# Patient Record
Sex: Female | Born: 1965
Health system: Southern US, Community
[De-identification: ages and names within clinical notes are randomized; demographics above are authoritative.]

---

## 2008-10-20 ENCOUNTER — Ambulatory Visit: Payer: Self-pay | Admitting: Obstetrics & Gynecology

## 2008-10-23 ENCOUNTER — Ambulatory Visit: Payer: Self-pay | Admitting: Obstetrics & Gynecology

## 2012-09-19 ENCOUNTER — Ambulatory Visit: Payer: Self-pay

## 2012-10-03 ENCOUNTER — Ambulatory Visit: Payer: Self-pay

## 2013-11-18 ENCOUNTER — Emergency Department: Payer: Self-pay | Admitting: Emergency Medicine

## 2015-10-20 ENCOUNTER — Inpatient Hospital Stay
Admission: RE | Admit: 2015-10-20 | Discharge: 2015-10-20 | Disposition: A | Discharge status: Home / Self Care | Payer: Self-pay | Source: Ambulatory Visit | Attending: *Deleted | Admitting: *Deleted

## 2015-10-20 ENCOUNTER — Other Ambulatory Visit: Payer: Self-pay | Admitting: *Deleted

## 2015-10-20 DIAGNOSIS — Z9289 Personal history of other medical treatment: Secondary | ICD-10-CM

## 2015-10-26 ENCOUNTER — Ambulatory Visit: Payer: Self-pay | Attending: Oncology

## 2015-10-26 ENCOUNTER — Ambulatory Visit
Admission: RE | Admit: 2015-10-26 | Discharge: 2015-10-26 | Disposition: A | Discharge status: Home / Self Care | Payer: Self-pay | Source: Ambulatory Visit | Attending: Oncology | Admitting: Oncology

## 2015-10-26 VITALS — BP 135/83 | HR 70 | Temp 98.1°F | Resp 20 | Ht 64.57 in | Wt 163.6 lb

## 2015-10-26 DIAGNOSIS — Z0000 Encounter for general adult medical examination without abnormal findings: Secondary | ICD-10-CM

## 2015-10-26 NOTE — Progress Notes (Signed)
Subjective:     Patient ID: Thomes DinningRene B Risdon, female   DOB: 03/20/1966, 49 y.o.   MRN: [id number]  HPI   Review of Systems     Objective:   Physical Exam  Pulmonary/Chest:   Right breast exhibits no inverted nipple, no mass, no nipple discharge, no skin change and no tenderness. Left breast exhibits no inverted nipple, no mass, no nipple discharge, no skin change and no tenderness. Breasts are asymmetrical.    Patient had esophageal surgery as an infant with scars on right side, back, and mid abdomen.  This is the cause of right breast being 1 cup size smaller than left.       Assessment:     49 [facility]  She had Insurance past two years and [facility]. Patient screened, and meets [facility] eligibility.  Patient does not have insurance, Medicare or Medicaid.  Handout given on Affordable Care Act. Instructed patient on breast self-exam using teach back method    Plan:     Sent for bilateral screening mammogram.

## 2015-11-09 NOTE — Progress Notes (Signed)
Letter mailed from [facility] to notify of normal mammogram results.  Patient to return in one year for annual screening.  Copy to [facility]. 

## 2016-09-27 ENCOUNTER — Other Ambulatory Visit: Payer: Self-pay | Admitting: Obstetrics and Gynecology

## 2016-09-27 DIAGNOSIS — Z1231 Encounter for screening mammogram for malignant neoplasm of breast: Secondary | ICD-10-CM

## 2016-10-31 ENCOUNTER — Ambulatory Visit: Payer: Self-pay

## 2016-12-09 ENCOUNTER — Ambulatory Visit
Admission: RE | Admit: 2016-12-09 | Discharge: 2016-12-09 | Disposition: A | Discharge status: Home / Self Care | Payer: BLUE CROSS/BLUE SHIELD | Source: Ambulatory Visit | Attending: Obstetrics and Gynecology | Admitting: Obstetrics and Gynecology

## 2016-12-09 DIAGNOSIS — Z1231 Encounter for screening mammogram for malignant neoplasm of breast: Secondary | ICD-10-CM | POA: Diagnosis not present

## 2017-12-21 ENCOUNTER — Emergency Department: Payer: BLUE CROSS/BLUE SHIELD

## 2017-12-21 ENCOUNTER — Other Ambulatory Visit: Payer: Self-pay

## 2017-12-21 ENCOUNTER — Emergency Department
Admission: EM | Admit: 2017-12-21 | Discharge: 2017-12-21 | Disposition: A | Discharge status: Home / Self Care | Payer: BLUE CROSS/BLUE SHIELD | Attending: Emergency Medicine | Admitting: Emergency Medicine

## 2017-12-21 DIAGNOSIS — M79601 Pain in right arm: Secondary | ICD-10-CM | POA: Insufficient documentation

## 2017-12-21 MED ORDER — IBUPROFEN 800 MG PO TABS: 800.0000 mg | ORAL_TABLET | Freq: Three times a day (TID) | ORAL | 0 refills | Status: AC | PRN

## 2017-12-21 MED ORDER — CYCLOBENZAPRINE HCL 5 MG PO TABS: ORAL_TABLET | ORAL | 0 refills | Status: DC
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
## 2018-01-08 ENCOUNTER — Other Ambulatory Visit: Payer: Self-pay | Admitting: Obstetrics and Gynecology

## 2018-01-08 DIAGNOSIS — Z1231 Encounter for screening mammogram for malignant neoplasm of breast: Secondary | ICD-10-CM

## 2018-01-19 ENCOUNTER — Ambulatory Visit
Admission: RE | Admit: 2018-01-19 | Discharge: 2018-01-19 | Disposition: A | Discharge status: Home / Self Care | Payer: BLUE CROSS/BLUE SHIELD | Source: Ambulatory Visit | Attending: Obstetrics and Gynecology | Admitting: Obstetrics and Gynecology

## 2018-01-19 DIAGNOSIS — Z1231 Encounter for screening mammogram for malignant neoplasm of breast: Secondary | ICD-10-CM | POA: Insufficient documentation

## 2018-06-12 IMAGING — MG MM DIGITAL SCREENING BILAT W/ CAD
5 series · 5 of 5 positions shown · non-contrast
Comparison: Previous exam(s).

CLINICAL DATA: Screening. Prior right lateral chest wall surgery
with the scar extending into the far outer right breast.

EXAM:
DIGITAL SCREENING BILATERAL MAMMOGRAM WITH CAD

[R CC (1 of 2)]
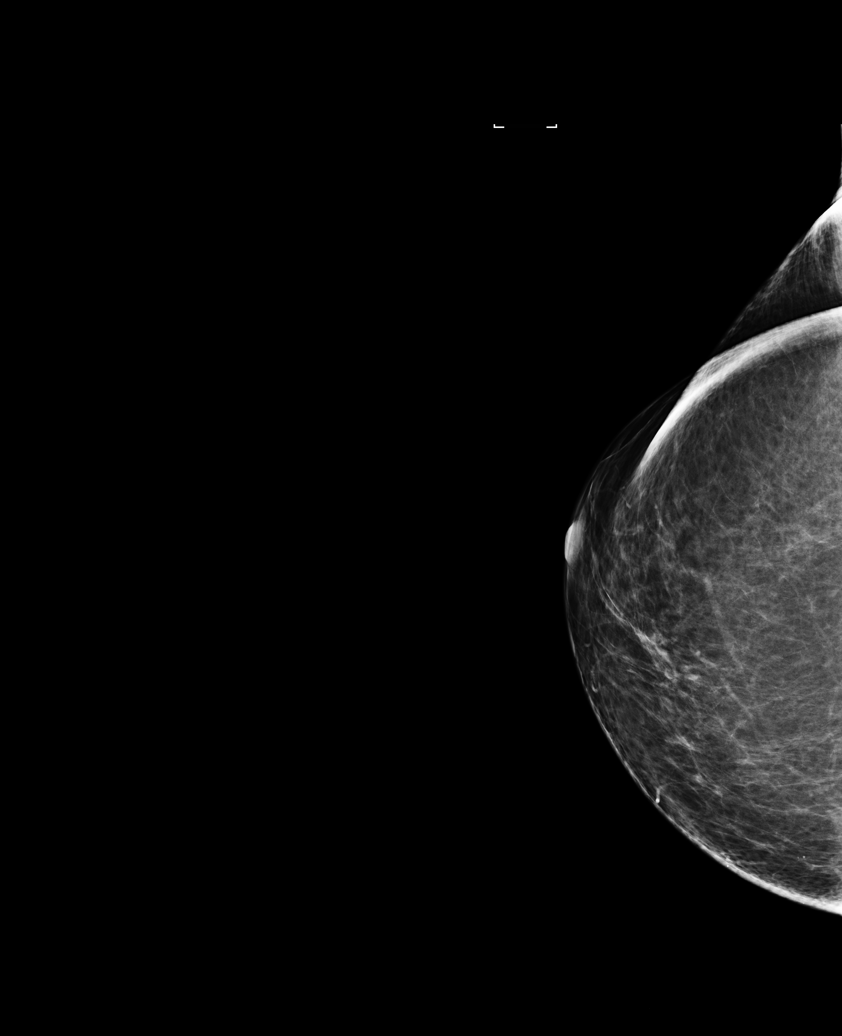

[R CC (2 of 2)]
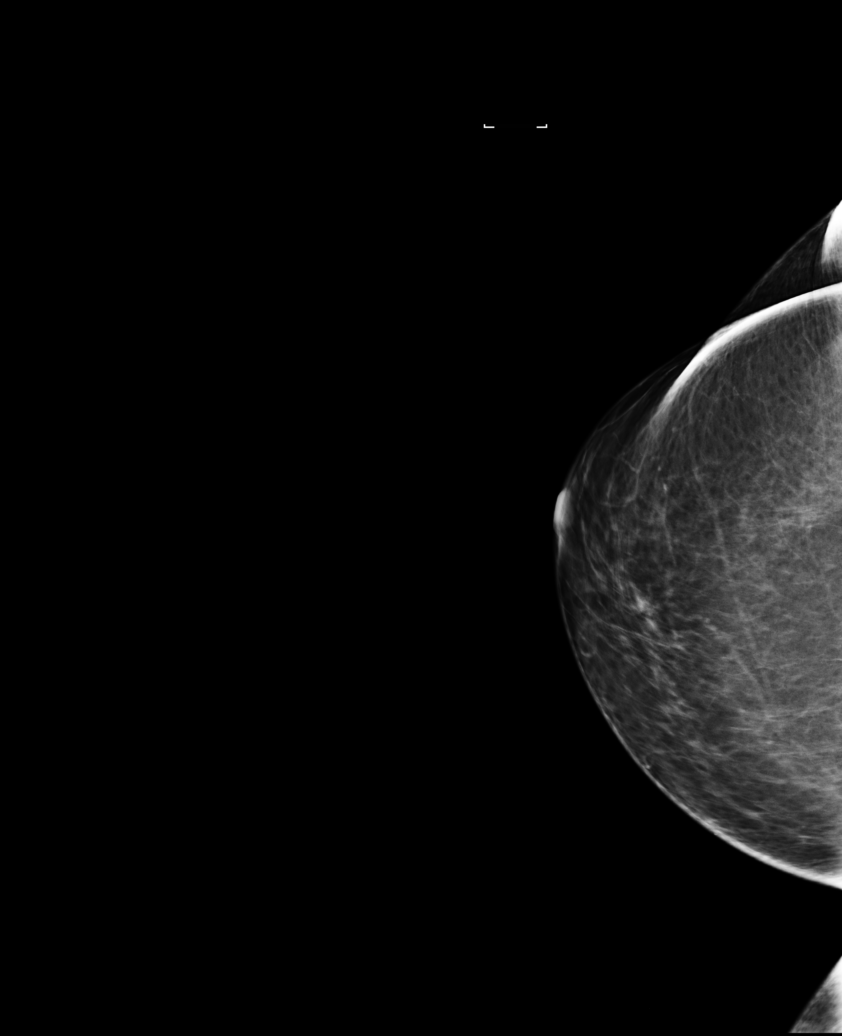

[L MLO]
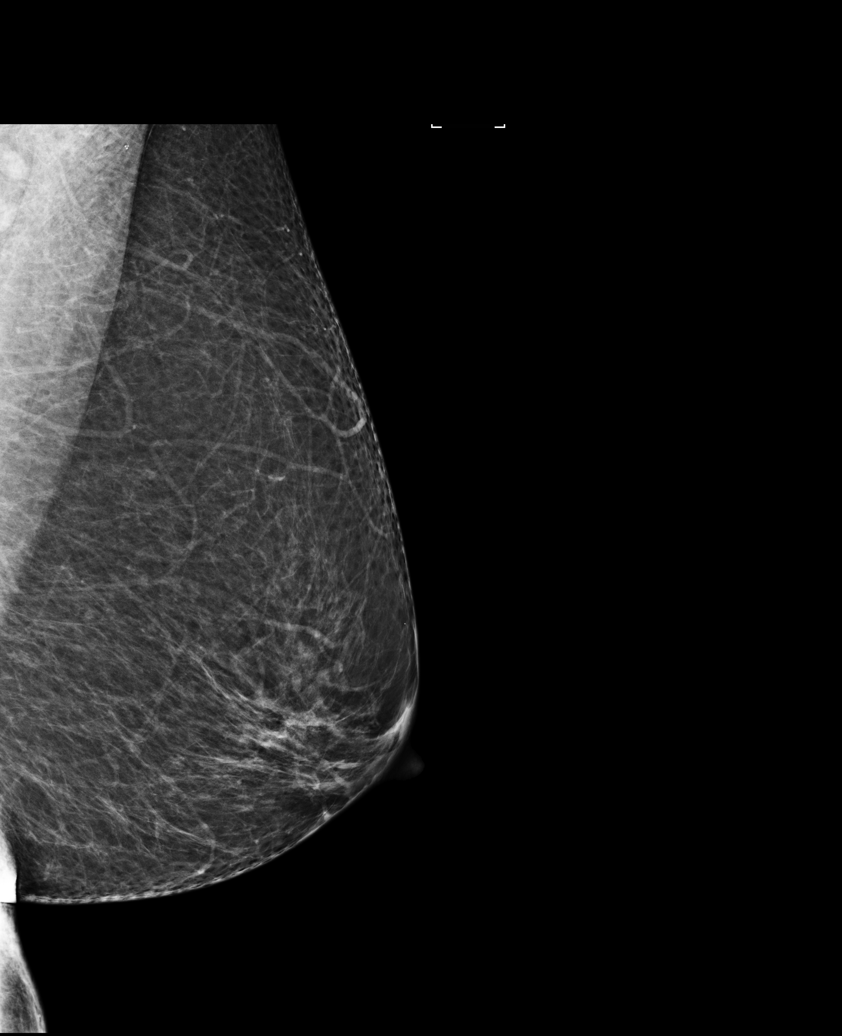

[L CC]
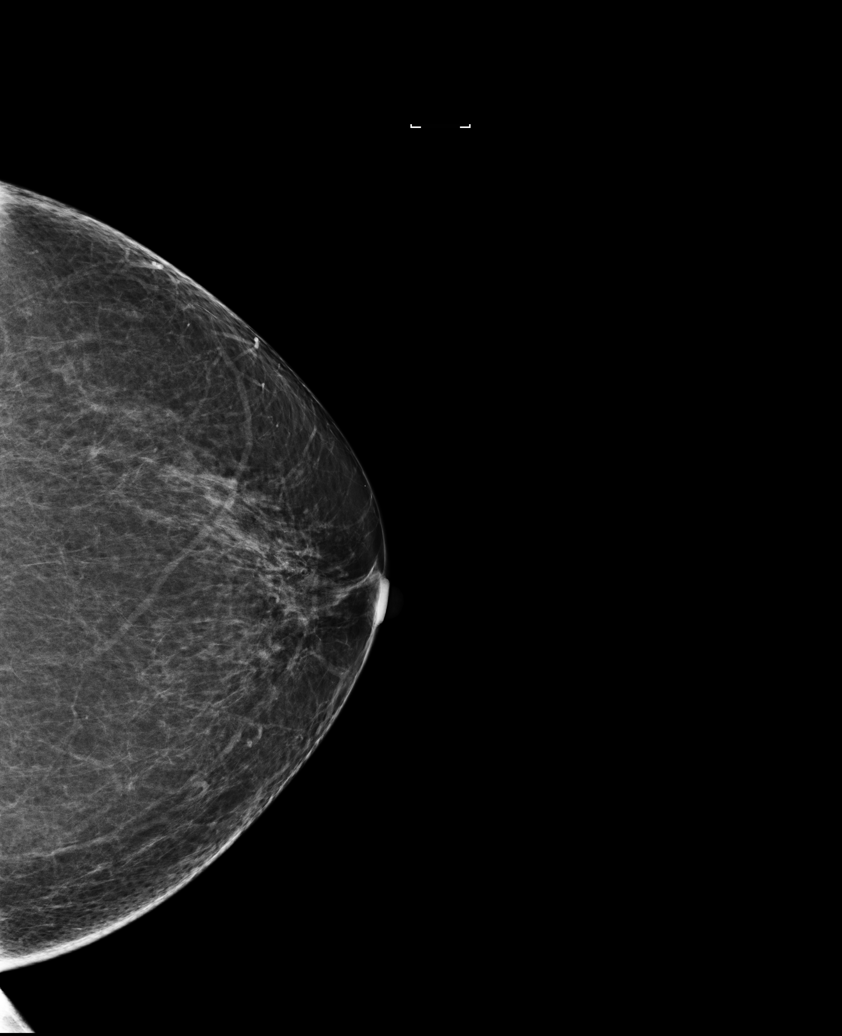

[R MLO]
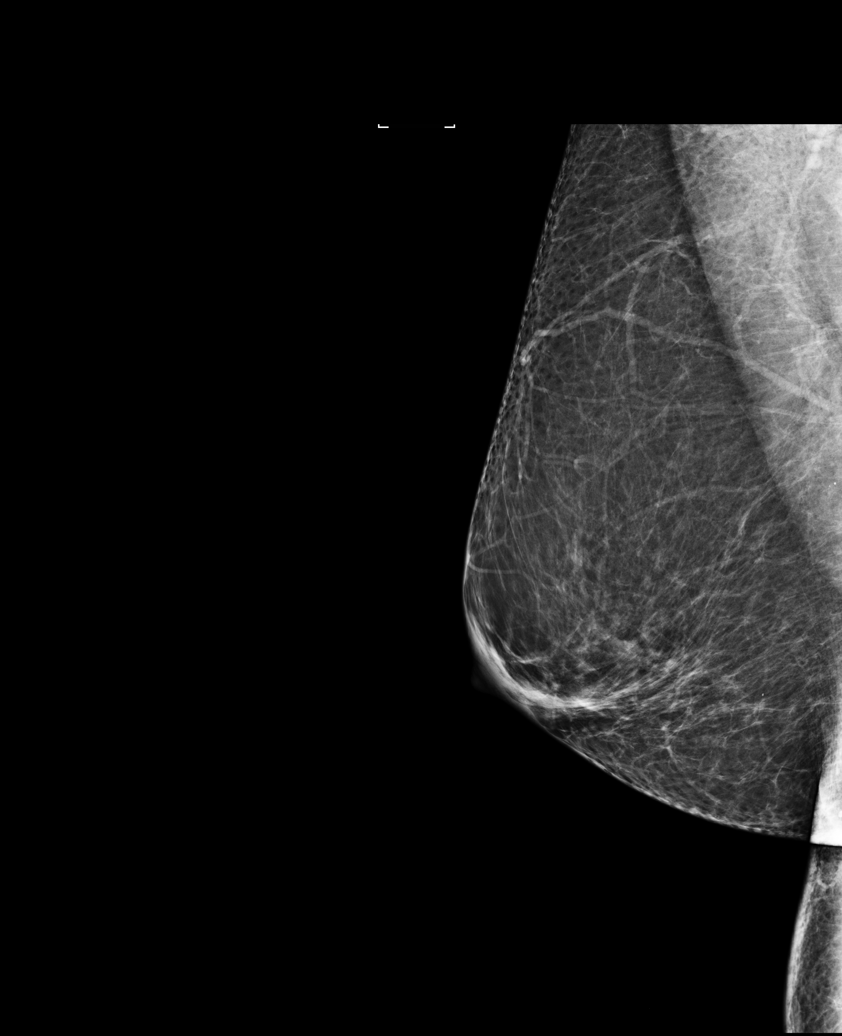

[5 of 5 positions shown; findings below may reference images not displayed]

ACR Breast Density Category b: There are scattered areas of
fibroglandular density.
FINDINGS: There are no findings suspicious for malignancy. Stable post
surgical scarring involving the outer right breast, most conspicuous
on the CC view. Images were processed with CAD.
IMPRESSION: No mammographic evidence of malignancy. A result letter of this
screening mammogram will be mailed directly to the patient.

RECOMMENDATION:
Screening mammogram in one year. (Code:LC-6-8YW)

BI-RADS CATEGORY  2: Benign.

## 2019-01-14 ENCOUNTER — Other Ambulatory Visit: Payer: Self-pay | Admitting: Obstetrics and Gynecology

## 2019-01-14 DIAGNOSIS — Z1231 Encounter for screening mammogram for malignant neoplasm of breast: Secondary | ICD-10-CM

## 2019-01-28 ENCOUNTER — Ambulatory Visit
Admission: RE | Admit: 2019-01-28 | Discharge: 2019-01-28 | Disposition: A | Discharge status: Home / Self Care | Payer: BLUE CROSS/BLUE SHIELD | Source: Ambulatory Visit | Attending: Obstetrics and Gynecology | Admitting: Obstetrics and Gynecology

## 2019-01-28 DIAGNOSIS — Z1231 Encounter for screening mammogram for malignant neoplasm of breast: Secondary | ICD-10-CM | POA: Insufficient documentation

## 2019-02-11 DIAGNOSIS — N762 Acute vulvitis: Secondary | ICD-10-CM | POA: Diagnosis not present

## 2019-09-07 ENCOUNTER — Emergency Department (HOSPITAL_COMMUNITY)
Admission: EM | Admit: 2019-09-07 | Discharge: 2019-09-07 | Disposition: A | Discharge status: Home / Self Care | Payer: BLUE CROSS/BLUE SHIELD | Attending: Emergency Medicine | Admitting: Emergency Medicine

## 2019-09-07 ENCOUNTER — Other Ambulatory Visit: Payer: Self-pay

## 2019-09-07 ENCOUNTER — Encounter (HOSPITAL_COMMUNITY): Payer: Self-pay

## 2019-09-07 ENCOUNTER — Emergency Department (HOSPITAL_COMMUNITY): Payer: BLUE CROSS/BLUE SHIELD

## 2019-09-07 DIAGNOSIS — K219 Gastro-esophageal reflux disease without esophagitis: Secondary | ICD-10-CM | POA: Diagnosis not present

## 2019-09-07 DIAGNOSIS — R0789 Other chest pain: Secondary | ICD-10-CM | POA: Insufficient documentation

## 2019-09-07 DIAGNOSIS — R079 Chest pain, unspecified: Secondary | ICD-10-CM | POA: Diagnosis not present

## 2019-09-07 LAB — TROPONIN I (HIGH SENSITIVITY)
Troponin I (High Sensitivity): 4 ng/L (ref <18)
Troponin I (High Sensitivity): 4 ng/L (ref <18)

## 2019-09-07 LAB — BASIC METABOLIC PANEL
Anion gap: 8 (ref 5–15)
BUN: 14 mg/dL (ref 6–20)
CO2: 27 mmol/L (ref 22–32)
Calcium: 9.7 mg/dL (ref 8.9–10.3)
Chloride: 107 mmol/L (ref 98–111)
Creatinine, Ser: 1.24 mg/dL — ABNORMAL HIGH (ref 0.44–1.00)
GFR calc Af Amer: 57 mL/min — ABNORMAL LOW (ref >60)
GFR calc non Af Amer: 49 mL/min — ABNORMAL LOW (ref >60)
Glucose, Bld: 91 mg/dL (ref 70–99)
Potassium: 4 mmol/L (ref 3.5–5.1)
Sodium: 142 mmol/L (ref 135–145)

## 2019-09-07 LAB — CBC
HCT: 43 % (ref 36.0–46.0)
Hemoglobin: 13.9 g/dL (ref 12.0–15.0)
MCH: 30.8 pg (ref 26.0–34.0)
MCHC: 32.3 g/dL (ref 30.0–36.0)
MCV: 95.1 fL (ref 80.0–100.0)
Platelets: 255 10*3/uL (ref 150–400)
RBC: 4.52 MIL/uL (ref 3.87–5.11)
RDW: 11.5 % (ref 11.5–15.5)
WBC: 8.1 10*3/uL (ref 4.0–10.5)
nRBC: 0 % (ref 0.0–0.2)

## 2019-09-07 LAB — I-STAT BETA HCG BLOOD, ED (MC, WL, AP ONLY): I-stat hCG, quantitative: <5 m[IU]/mL (ref <5)

## 2019-09-07 MED ORDER — CYCLOBENZAPRINE HCL 10 MG PO TABS
10.0000 mg | ORAL_TABLET | Freq: Once | ORAL | Status: AC
  Administered 2019-09-07: 18:00:00 10 mg via ORAL
  Filled 2019-09-07: qty 1

## 2019-09-07 MED ORDER — CYCLOBENZAPRINE HCL 5 MG PO TABS: ORAL_TABLET | ORAL | 0 refills | Status: AC

## 2019-09-07 MED ORDER — SODIUM CHLORIDE 0.9% FLUSH: 3.0000 mL | Freq: Once | INTRAVENOUS | Status: DC

## 2019-09-07 NOTE — ED Triage Notes (Signed)
Pt presents with Left side chest pain radiating to her back x3 days

## 2019-09-07 NOTE — ED Provider Notes (Signed)
[facility]ENC[facility] Provider Note   CSN: [id number] Arrival date & time: 09/07/19  1558     History   Chief Complaint Chief Complaint  Patient presents with  . Chest Pain    HPI Thomes DinningRene B Tersigni is a 53 y.o. female with PMHx of esophageal reflux presenting to the ED with chest pain since Thursday that started when she was cleaning. Some relief with muscle relaxant but this morning feels like pain has shifted to left shoulder and wanted to get it checked out. Patient has taken muscle relaxant and aleve today without any significant relief. Patient reports that pain is worse with movement. She denies any shortness of breath, chest tightness, headache, dizziness, nausea or vomiting.    History reviewed. No pertinent past medical history.  There are no active problems to display for this patient.   History reviewed. No pertinent surgical history.   OB History   No obstetric history on file.      Home Medications    Prior to Admission medications   Medication Sig Start Date End Date Taking? Authorizing Provider  cyclobenzaprine (FLEXERIL) 5 MG tablet Take 1-2 tablets 3 times daily as needed 12/21/17   Enid DerryWagner, Ashley, PA-C  ibuprofen (ADVIL,MOTRIN) 800 MG tablet Take 1 tablet (800 mg total) by mouth every 8 (eight) hours as needed. 12/21/17   Enid DerryWagner, Ashley, PA-C    Family History Family History  Problem Relation Age of Onset  . Breast cancer Neg Hx     Social History Social History   Tobacco Use  . Smoking status: Never Smoker  . Smokeless tobacco: Never Used  Substance Use Topics  . Alcohol use: Not on file  . Drug use: Not on file     Allergies   Aspirin   Review of Systems Review of Systems  Constitutional: Negative for activity change, chills, diaphoresis and fever.  HENT: Negative for sore throat.   Eyes: Negative for photophobia and visual disturbance.  Respiratory: Negative for choking, shortness of breath and wheezing.    Cardiovascular: Positive for chest pain. Negative for palpitations.       Left sided chest pain  Gastrointestinal: Negative.   Endocrine: Negative.   Genitourinary: Negative.   Musculoskeletal: Positive for arthralgias. Negative for back pain, joint swelling, neck pain and neck stiffness.       Left shoulder pain  Allergic/Immunologic: Negative.   Neurological: Negative for dizziness, numbness and headaches.  Hematological: Negative.   Psychiatric/Behavioral: Negative.    Physical Exam Updated Vital Signs BP (!) 155/70   Pulse 77   Temp 97.8 F (36.6 C) (Oral)   Resp 16   LMP 11/25/2016   SpO2 100%   Physical Exam Vitals signs reviewed.  Constitutional:      General: She is not in acute distress.    Appearance: She is well-developed. She is not diaphoretic.  HENT:     Head: Normocephalic and atraumatic.  Cardiovascular:     Rate and Rhythm: Normal rate and regular rhythm.     Heart sounds: Normal heart sounds. Heart sounds not distant. No murmur. No friction rub. No gallop. No S3 or S4 sounds.   Pulmonary:     Effort: Pulmonary effort is normal. No respiratory distress.     Breath sounds: Normal breath sounds.  Chest:     Chest wall: No tenderness, crepitus or edema.  Abdominal:     General: Bowel sounds are normal.     Palpations: Abdomen is soft.  Tenderness: There is no abdominal tenderness. There is no guarding.  Musculoskeletal: Normal range of motion.     Comments: Pain with active flexion of shoulder  Skin:    General: Skin is warm and dry.     Capillary Refill: Capillary refill takes less than 2 seconds.  Neurological:     General: No focal deficit present.     Mental Status: She is alert and oriented to person, place, and time.     Motor: No weakness.     ED Treatments / Results  Labs (all labs ordered are listed, but only abnormal results are displayed) Labs Reviewed  BASIC METABOLIC PANEL - Abnormal; Notable for the following components:       Result Value   Creatinine, Ser 1.24 (*)    GFR calc non Af Amer 49 (*)    GFR calc Af Amer 57 (*)    All other components within normal limits  CBC  I-STAT BETA HCG BLOOD, ED ([facility], [facility], [facility] ONLY)  TROPONIN I (HIGH SENSITIVITY)  TROPONIN I (HIGH SENSITIVITY)    EKG EKG Interpretation  Date/Time:  Saturday September 07 2019 16:04:25 EDT Ventricular Rate:  83 PR Interval:  136 QRS Duration: 82 QT Interval:  390 QTC Calculation: 458 R Axis:   82 Text Interpretation:  Normal sinus rhythm Possible Left atrial enlargement No previous tracing Confirmed by Gwyneth Sprout ([id number]) on 09/07/2019 5:14:05 PM   Radiology Dg Chest 2 View  Result Date: 09/07/2019 CLINICAL DATA:  Chest pain EXAM: CHEST - 2 VIEW COMPARISON:  None. FINDINGS: No acute opacity or pleural effusion. Normal cardiomediastinal silhouette. No pneumothorax. Bilateral right greater than left rib anomalies. IMPRESSION: No active cardiopulmonary disease. Electronically Signed   By: Jasmine Pang M.D.   On: 09/07/2019 16:54    Procedures Procedures (including critical care time)  Medications Ordered in ED Medications  sodium chloride flush (NS) 0.9 % injection 3 mL (has no administration in time range)  cyclobenzaprine (FLEXERIL) tablet 10 mg (has no administration in time range)     Initial Impression / Assessment and Plan / ED Course  I have reviewed the triage vital signs and the nursing notes.  Pertinent labs & imaging results that were available during my care of the patient were reviewed by me and considered in my medical decision making (see chart for details).  Patient is a 52yo female with hx of reflux presenting with left sided chest pain for 3 days duration that is radiating to the left shoulder. Patient reports that pain started when she was cleaning on Thursday and was left sided in nature. She took some Aleve which relieved the pain. However, this morning, patient reports that she woke up with left shoulder  pain that is similar in nature to the chest pain she experienced on Thursday. She tried taking muscle relaxants and Aleve but that did not provide any significant relief. Pain is worsened with movement. She spoke with her sister who prompted her to present to the ED for further evaluation. Patient denies any continued chest pain, shortness of breath, nausea/vomiting, headache or dizziness.  She appears comfortable and does not appear to be in any acute distress. No cardiac murmurs, rubs or gallops appreciated. EKG with NSR and no acute ST changes. No cardiopulmonary disease on CXR noted. Serial troponins without change. Slight elevation in Cr level but no prior values to compare to so would recommend follow up with PCP.   Patient given flexeril in ED with improvement in symptoms.  Patient discharged to home with instructions to continue flexeril and apply heat/ice to the area. She was also instructed to follow up with PCP regarding increased creatinine. Strict return precautions provided. Patient agrees with discharge planning.   Final Clinical Impressions(s) / ED Diagnoses   Final diagnoses:  None    ED Discharge Orders    None       Harvie Heck, MD 09/07/19 2042    Blanchie Dessert, MD 09/08/19 1851

## 2019-09-07 NOTE — Discharge Instructions (Addendum)
Ms. Mandy Reed, Mandy Reed were seen in the ED today for concerns of chest pain radiating to your left shoulder. After a complete work up, it does not appear that your chest pain is cardiac in nature. It is likely due to a muscle strain. Please use your flexeril at home as needed and use ice/heat to the area as tolerated.  On labs, your creatinine was slightly elevated. Please follow up with a PCP for further evaluation. In the meantime, assure adequate hydration and avoid NSAID use.  If your chest pain worsens or you develop shortness of breath, or fevers/chills, please seek emergent medical care.

## 2019-10-01 DIAGNOSIS — Z23 Encounter for immunization: Secondary | ICD-10-CM | POA: Diagnosis not present

## 2019-10-01 DIAGNOSIS — M79671 Pain in right foot: Secondary | ICD-10-CM | POA: Diagnosis not present

## 2019-10-01 DIAGNOSIS — M76821 Posterior tibial tendinitis, right leg: Secondary | ICD-10-CM | POA: Diagnosis not present

## 2019-10-08 DIAGNOSIS — Z1159 Encounter for screening for other viral diseases: Secondary | ICD-10-CM | POA: Diagnosis not present

## 2019-10-11 DIAGNOSIS — D122 Benign neoplasm of ascending colon: Secondary | ICD-10-CM | POA: Diagnosis not present

## 2019-10-11 DIAGNOSIS — Z1211 Encounter for screening for malignant neoplasm of colon: Secondary | ICD-10-CM | POA: Diagnosis not present

## 2020-03-10 DIAGNOSIS — Z20822 Contact with and (suspected) exposure to covid-19: Secondary | ICD-10-CM | POA: Diagnosis not present

## 2020-05-29 DIAGNOSIS — Z1231 Encounter for screening mammogram for malignant neoplasm of breast: Secondary | ICD-10-CM | POA: Diagnosis not present

## 2020-05-29 DIAGNOSIS — Z0001 Encounter for general adult medical examination with abnormal findings: Secondary | ICD-10-CM | POA: Diagnosis not present

## 2020-05-29 DIAGNOSIS — Z23 Encounter for immunization: Secondary | ICD-10-CM | POA: Diagnosis not present

## 2020-05-29 DIAGNOSIS — Z1211 Encounter for screening for malignant neoplasm of colon: Secondary | ICD-10-CM | POA: Diagnosis not present

## 2020-06-01 ENCOUNTER — Other Ambulatory Visit: Payer: Self-pay | Admitting: Internal Medicine

## 2020-06-01 ENCOUNTER — Other Ambulatory Visit: Payer: Self-pay | Admitting: Obstetrics and Gynecology

## 2020-06-01 DIAGNOSIS — R221 Localized swelling, mass and lump, neck: Secondary | ICD-10-CM

## 2020-06-01 DIAGNOSIS — Z1231 Encounter for screening mammogram for malignant neoplasm of breast: Secondary | ICD-10-CM

## 2020-06-05 DIAGNOSIS — Z0001 Encounter for general adult medical examination with abnormal findings: Secondary | ICD-10-CM | POA: Diagnosis not present

## 2020-06-05 DIAGNOSIS — Z131 Encounter for screening for diabetes mellitus: Secondary | ICD-10-CM | POA: Diagnosis not present

## 2020-06-05 DIAGNOSIS — Z1329 Encounter for screening for other suspected endocrine disorder: Secondary | ICD-10-CM | POA: Diagnosis not present

## 2020-06-05 DIAGNOSIS — Z136 Encounter for screening for cardiovascular disorders: Secondary | ICD-10-CM | POA: Diagnosis not present

## 2020-06-12 ENCOUNTER — Ambulatory Visit: Payer: BLUE CROSS/BLUE SHIELD

## 2020-06-12 ENCOUNTER — Other Ambulatory Visit: Payer: BLUE CROSS/BLUE SHIELD

## 2020-06-19 ENCOUNTER — Ambulatory Visit: Payer: BLUE CROSS/BLUE SHIELD

## 2020-06-22 ENCOUNTER — Ambulatory Visit
Admission: RE | Admit: 2020-06-22 | Discharge: 2020-06-22 | Disposition: A | Discharge status: Home / Self Care | Payer: BC Managed Care – PPO | Source: Ambulatory Visit | Attending: Obstetrics and Gynecology | Admitting: Obstetrics and Gynecology

## 2020-06-22 ENCOUNTER — Ambulatory Visit
Admission: RE | Admit: 2020-06-22 | Discharge: 2020-06-22 | Disposition: A | Discharge status: Home / Self Care | Payer: BC Managed Care – PPO | Source: Ambulatory Visit | Attending: Internal Medicine | Admitting: Internal Medicine

## 2020-06-22 ENCOUNTER — Other Ambulatory Visit: Payer: Self-pay

## 2020-06-22 DIAGNOSIS — R221 Localized swelling, mass and lump, neck: Secondary | ICD-10-CM | POA: Diagnosis not present

## 2020-06-22 DIAGNOSIS — K228 Other specified diseases of esophagus: Secondary | ICD-10-CM | POA: Diagnosis not present

## 2020-06-22 DIAGNOSIS — Z1231 Encounter for screening mammogram for malignant neoplasm of breast: Secondary | ICD-10-CM

## 2020-06-22 DIAGNOSIS — M419 Scoliosis, unspecified: Secondary | ICD-10-CM | POA: Diagnosis not present

## 2020-06-22 DIAGNOSIS — M958 Other specified acquired deformities of musculoskeletal system: Secondary | ICD-10-CM | POA: Diagnosis not present

## 2020-06-22 MED ORDER — IOPAMIDOL (ISOVUE-300) INJECTION 61%
75.0000 mL | Freq: Once | INTRAVENOUS | Status: AC | PRN
  Administered 2020-06-22: 16:00:00 75 mL via INTRAVENOUS

## 2020-07-09 DIAGNOSIS — R7303 Prediabetes: Secondary | ICD-10-CM | POA: Diagnosis not present

## 2020-07-09 DIAGNOSIS — I10 Essential (primary) hypertension: Secondary | ICD-10-CM | POA: Diagnosis not present

## 2020-07-09 DIAGNOSIS — E785 Hyperlipidemia, unspecified: Secondary | ICD-10-CM | POA: Diagnosis not present

## 2020-07-09 DIAGNOSIS — E663 Overweight: Secondary | ICD-10-CM | POA: Diagnosis not present

## 2020-09-29 DIAGNOSIS — E785 Hyperlipidemia, unspecified: Secondary | ICD-10-CM | POA: Diagnosis not present

## 2020-09-29 DIAGNOSIS — R7303 Prediabetes: Secondary | ICD-10-CM | POA: Diagnosis not present

## 2020-09-29 DIAGNOSIS — I10 Essential (primary) hypertension: Secondary | ICD-10-CM | POA: Diagnosis not present

## 2020-09-29 DIAGNOSIS — E663 Overweight: Secondary | ICD-10-CM | POA: Diagnosis not present

## 2020-09-29 DIAGNOSIS — Z23 Encounter for immunization: Secondary | ICD-10-CM | POA: Diagnosis not present

## 2020-11-03 DIAGNOSIS — F419 Anxiety disorder, unspecified: Secondary | ICD-10-CM | POA: Diagnosis not present

## 2020-11-03 DIAGNOSIS — G4709 Other insomnia: Secondary | ICD-10-CM | POA: Diagnosis not present

## 2020-12-09 DIAGNOSIS — Z20822 Contact with and (suspected) exposure to covid-19: Secondary | ICD-10-CM | POA: Diagnosis not present

## 2021-01-07 DIAGNOSIS — H40013 Open angle with borderline findings, low risk, bilateral: Secondary | ICD-10-CM | POA: Diagnosis not present

## 2021-08-22 DIAGNOSIS — Z20822 Contact with and (suspected) exposure to covid-19: Secondary | ICD-10-CM | POA: Diagnosis not present

## 2021-09-02 DIAGNOSIS — I10 Essential (primary) hypertension: Secondary | ICD-10-CM | POA: Diagnosis not present

## 2021-09-02 DIAGNOSIS — R7303 Prediabetes: Secondary | ICD-10-CM | POA: Diagnosis not present

## 2021-09-02 DIAGNOSIS — Z0001 Encounter for general adult medical examination with abnormal findings: Secondary | ICD-10-CM | POA: Diagnosis not present

## 2021-09-02 DIAGNOSIS — F411 Generalized anxiety disorder: Secondary | ICD-10-CM | POA: Diagnosis not present

## 2021-09-02 DIAGNOSIS — Z23 Encounter for immunization: Secondary | ICD-10-CM | POA: Diagnosis not present

## 2021-09-02 DIAGNOSIS — Z01419 Encounter for gynecological examination (general) (routine) without abnormal findings: Secondary | ICD-10-CM | POA: Diagnosis not present

## 2021-09-02 DIAGNOSIS — E785 Hyperlipidemia, unspecified: Secondary | ICD-10-CM | POA: Diagnosis not present

## 2021-09-02 DIAGNOSIS — N182 Chronic kidney disease, stage 2 (mild): Secondary | ICD-10-CM | POA: Diagnosis not present

## 2021-09-02 DIAGNOSIS — Z1211 Encounter for screening for malignant neoplasm of colon: Secondary | ICD-10-CM | POA: Diagnosis not present

## 2021-09-02 DIAGNOSIS — R8290 Unspecified abnormal findings in urine: Secondary | ICD-10-CM | POA: Diagnosis not present

## 2021-09-03 ENCOUNTER — Other Ambulatory Visit: Payer: Self-pay | Admitting: Internal Medicine

## 2021-09-03 DIAGNOSIS — Z1231 Encounter for screening mammogram for malignant neoplasm of breast: Secondary | ICD-10-CM

## 2021-09-16 DIAGNOSIS — E663 Overweight: Secondary | ICD-10-CM | POA: Diagnosis not present

## 2021-09-16 DIAGNOSIS — E785 Hyperlipidemia, unspecified: Secondary | ICD-10-CM | POA: Diagnosis not present

## 2021-09-16 DIAGNOSIS — Z0001 Encounter for general adult medical examination with abnormal findings: Secondary | ICD-10-CM | POA: Diagnosis not present

## 2021-09-16 DIAGNOSIS — R7303 Prediabetes: Secondary | ICD-10-CM | POA: Diagnosis not present

## 2021-09-23 ENCOUNTER — Other Ambulatory Visit: Payer: Self-pay

## 2021-09-23 ENCOUNTER — Ambulatory Visit
Admission: RE | Admit: 2021-09-23 | Discharge: 2021-09-23 | Disposition: A | Discharge status: Home / Self Care | Payer: BC Managed Care – PPO | Source: Ambulatory Visit | Attending: Internal Medicine | Admitting: Internal Medicine

## 2021-09-23 DIAGNOSIS — Z1231 Encounter for screening mammogram for malignant neoplasm of breast: Secondary | ICD-10-CM | POA: Diagnosis not present

## 2021-10-05 DIAGNOSIS — I10 Essential (primary) hypertension: Secondary | ICD-10-CM | POA: Diagnosis not present

## 2021-10-05 DIAGNOSIS — N182 Chronic kidney disease, stage 2 (mild): Secondary | ICD-10-CM | POA: Diagnosis not present

## 2021-10-05 DIAGNOSIS — E785 Hyperlipidemia, unspecified: Secondary | ICD-10-CM | POA: Diagnosis not present

## 2022-01-06 DIAGNOSIS — L708 Other acne: Secondary | ICD-10-CM | POA: Diagnosis not present

## 2022-01-26 DIAGNOSIS — N182 Chronic kidney disease, stage 2 (mild): Secondary | ICD-10-CM | POA: Diagnosis not present

## 2022-01-28 DIAGNOSIS — I10 Essential (primary) hypertension: Secondary | ICD-10-CM | POA: Diagnosis not present

## 2022-01-28 DIAGNOSIS — N182 Chronic kidney disease, stage 2 (mild): Secondary | ICD-10-CM | POA: Diagnosis not present

## 2022-01-28 DIAGNOSIS — F411 Generalized anxiety disorder: Secondary | ICD-10-CM | POA: Diagnosis not present

## 2022-04-04 DIAGNOSIS — H40013 Open angle with borderline findings, low risk, bilateral: Secondary | ICD-10-CM | POA: Diagnosis not present

## 2022-05-23 DIAGNOSIS — Z0000 Encounter for general adult medical examination without abnormal findings: Secondary | ICD-10-CM | POA: Diagnosis not present

## 2022-05-23 DIAGNOSIS — E785 Hyperlipidemia, unspecified: Secondary | ICD-10-CM | POA: Diagnosis not present

## 2022-05-23 DIAGNOSIS — R7303 Prediabetes: Secondary | ICD-10-CM | POA: Diagnosis not present

## 2022-05-23 DIAGNOSIS — N182 Chronic kidney disease, stage 2 (mild): Secondary | ICD-10-CM | POA: Diagnosis not present

## 2022-05-27 DIAGNOSIS — I10 Essential (primary) hypertension: Secondary | ICD-10-CM | POA: Diagnosis not present

## 2022-05-27 DIAGNOSIS — R8290 Unspecified abnormal findings in urine: Secondary | ICD-10-CM | POA: Diagnosis not present

## 2022-05-27 DIAGNOSIS — Z1211 Encounter for screening for malignant neoplasm of colon: Secondary | ICD-10-CM | POA: Diagnosis not present

## 2022-05-27 DIAGNOSIS — N182 Chronic kidney disease, stage 2 (mild): Secondary | ICD-10-CM | POA: Diagnosis not present

## 2022-05-27 DIAGNOSIS — E785 Hyperlipidemia, unspecified: Secondary | ICD-10-CM | POA: Diagnosis not present

## 2022-05-27 DIAGNOSIS — Z23 Encounter for immunization: Secondary | ICD-10-CM | POA: Diagnosis not present

## 2022-05-27 DIAGNOSIS — Z1231 Encounter for screening mammogram for malignant neoplasm of breast: Secondary | ICD-10-CM | POA: Diagnosis not present

## 2022-05-27 DIAGNOSIS — K219 Gastro-esophageal reflux disease without esophagitis: Secondary | ICD-10-CM | POA: Diagnosis not present

## 2022-05-27 DIAGNOSIS — Z0001 Encounter for general adult medical examination with abnormal findings: Secondary | ICD-10-CM | POA: Diagnosis not present

## 2022-07-01 DIAGNOSIS — R42 Dizziness and giddiness: Secondary | ICD-10-CM | POA: Diagnosis not present

## 2022-07-01 DIAGNOSIS — N182 Chronic kidney disease, stage 2 (mild): Secondary | ICD-10-CM | POA: Diagnosis not present

## 2022-07-01 DIAGNOSIS — F411 Generalized anxiety disorder: Secondary | ICD-10-CM | POA: Diagnosis not present

## 2022-07-01 DIAGNOSIS — I10 Essential (primary) hypertension: Secondary | ICD-10-CM | POA: Diagnosis not present

## 2022-09-14 DIAGNOSIS — N182 Chronic kidney disease, stage 2 (mild): Secondary | ICD-10-CM | POA: Diagnosis not present

## 2022-09-16 ENCOUNTER — Other Ambulatory Visit: Payer: Self-pay | Admitting: Family Medicine

## 2022-09-16 DIAGNOSIS — N182 Chronic kidney disease, stage 2 (mild): Secondary | ICD-10-CM | POA: Diagnosis not present

## 2022-09-16 DIAGNOSIS — I10 Essential (primary) hypertension: Secondary | ICD-10-CM | POA: Diagnosis not present

## 2022-09-16 DIAGNOSIS — F411 Generalized anxiety disorder: Secondary | ICD-10-CM | POA: Diagnosis not present

## 2022-09-16 DIAGNOSIS — Z23 Encounter for immunization: Secondary | ICD-10-CM | POA: Diagnosis not present

## 2022-09-16 DIAGNOSIS — E663 Overweight: Secondary | ICD-10-CM | POA: Diagnosis not present

## 2022-09-20 ENCOUNTER — Ambulatory Visit
Admission: RE | Admit: 2022-09-20 | Discharge: 2022-09-20 | Disposition: A | Discharge status: Home / Self Care | Payer: BC Managed Care – PPO | Source: Ambulatory Visit | Attending: Family Medicine | Admitting: Family Medicine

## 2022-09-20 DIAGNOSIS — N182 Chronic kidney disease, stage 2 (mild): Secondary | ICD-10-CM

## 2023-08-01 ENCOUNTER — Other Ambulatory Visit: Payer: Self-pay | Admitting: Internal Medicine

## 2023-08-01 DIAGNOSIS — Z1231 Encounter for screening mammogram for malignant neoplasm of breast: Secondary | ICD-10-CM

## 2023-08-17 ENCOUNTER — Ambulatory Visit
Admission: RE | Admit: 2023-08-17 | Discharge: 2023-08-17 | Disposition: A | Discharge status: Home / Self Care | Payer: No Typology Code available for payment source | Source: Ambulatory Visit | Attending: Internal Medicine | Admitting: Internal Medicine

## 2023-08-17 DIAGNOSIS — Z1231 Encounter for screening mammogram for malignant neoplasm of breast: Secondary | ICD-10-CM | POA: Insufficient documentation

## 2024-03-15 ENCOUNTER — Other Ambulatory Visit: Payer: Self-pay | Admitting: Family Medicine

## 2024-03-15 DIAGNOSIS — Z1231 Encounter for screening mammogram for malignant neoplasm of breast: Secondary | ICD-10-CM

## 2024-07-22 DIAGNOSIS — N1831 Chronic kidney disease, stage 3a: Secondary | ICD-10-CM | POA: Diagnosis not present

## 2024-07-25 DIAGNOSIS — E663 Overweight: Secondary | ICD-10-CM | POA: Diagnosis not present

## 2024-07-25 DIAGNOSIS — E785 Hyperlipidemia, unspecified: Secondary | ICD-10-CM | POA: Diagnosis not present

## 2024-07-25 DIAGNOSIS — I10 Essential (primary) hypertension: Secondary | ICD-10-CM | POA: Diagnosis not present

## 2024-07-25 DIAGNOSIS — N1831 Chronic kidney disease, stage 3a: Secondary | ICD-10-CM | POA: Diagnosis not present

## 2024-10-08 ENCOUNTER — Encounter

## 2024-10-10 ENCOUNTER — Ambulatory Visit
Admission: RE | Admit: 2024-10-10 | Discharge: 2024-10-10 | Disposition: A | Discharge status: Home / Self Care | Source: Ambulatory Visit | Attending: Family Medicine | Admitting: Family Medicine

## 2024-10-10 DIAGNOSIS — Z1231 Encounter for screening mammogram for malignant neoplasm of breast: Secondary | ICD-10-CM | POA: Diagnosis not present
